# Patient Record
Sex: Female | Born: 1975 | Race: White | Hispanic: No | Marital: Married | Smoking: Never smoker
Health system: Southern US, Community
[De-identification: ages and names within clinical notes are randomized; demographics above are authoritative.]

## PROBLEM LIST (undated history)

## (undated) DIAGNOSIS — T4145XA Adverse effect of unspecified anesthetic, initial encounter: Secondary | ICD-10-CM

## (undated) DIAGNOSIS — T8859XA Other complications of anesthesia, initial encounter: Secondary | ICD-10-CM

## (undated) DIAGNOSIS — N979 Female infertility, unspecified: Secondary | ICD-10-CM

## (undated) DIAGNOSIS — IMO0002 Reserved for concepts with insufficient information to code with codable children: Secondary | ICD-10-CM

## (undated) HISTORY — PX: OTHER SURGICAL HISTORY: SHX169

## (undated) HISTORY — PX: LEEP: SHX91

---

## 2008-01-15 ENCOUNTER — Inpatient Hospital Stay (HOSPITAL_COMMUNITY): Admission: AD | Admit: 2008-01-15 | Discharge: 2008-01-15 | Payer: Self-pay | Admitting: Obstetrics and Gynecology

## 2009-08-06 ENCOUNTER — Inpatient Hospital Stay (HOSPITAL_COMMUNITY): Admission: AD | Admit: 2009-08-06 | Discharge: 2009-08-10 | Payer: Self-pay | Admitting: Obstetrics and Gynecology

## 2009-08-07 ENCOUNTER — Encounter (HOSPITAL_COMMUNITY): Payer: Self-pay | Admitting: Obstetrics and Gynecology

## 2010-09-22 LAB — CBC
MCV: 91.2 fL (ref 78.0–100.0)
Platelets: 221 10*3/uL (ref 150–400)
Platelets: 240 10*3/uL (ref 150–400)
WBC: 15.4 10*3/uL — ABNORMAL HIGH (ref 4.0–10.5)
WBC: 27.9 10*3/uL — ABNORMAL HIGH (ref 4.0–10.5)

## 2010-11-12 LAB — RPR: RPR: NONREACTIVE

## 2010-11-12 LAB — RUBELLA ANTIBODY, IGM: Rubella: IMMUNE

## 2010-11-12 LAB — ABO/RH: RH Type: POSITIVE

## 2010-11-12 LAB — CBC: Hemoglobin: 12.7 g/dL (ref 12.0–16.0)

## 2011-01-31 IMAGING — US US OB COMP +14 WK
1 series · 14 of 28 positions shown · non-contrast
Comparison: none

OBSTETRICAL ULTRASOUND:
 This ultrasound exam was performed in the [HOSPITAL] Ultrasound Department.  The OB US report was generated in the AS system, and faxed to the ordering physician.  This report is also available in [HOSPITAL]?s AccessANYware and in [REDACTED] PACS.

[Series 1: us ob comp +14 wk · 14 of 33 slices shown]
[im 2/33]
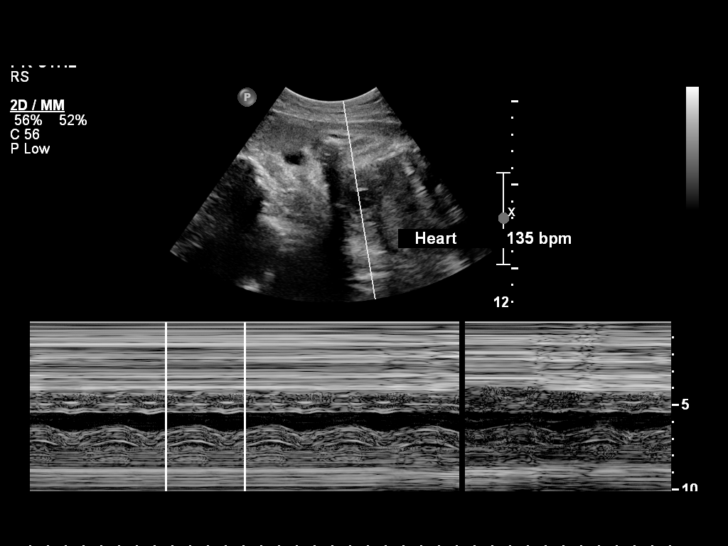
[im 4/33]
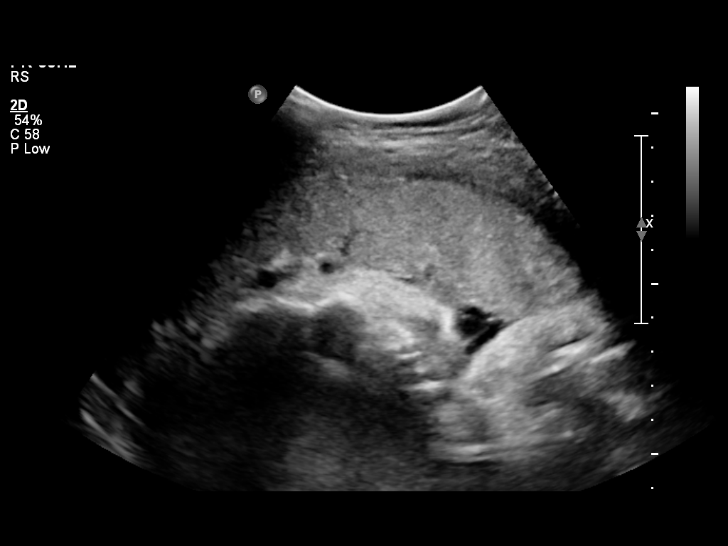
[im 6/33]
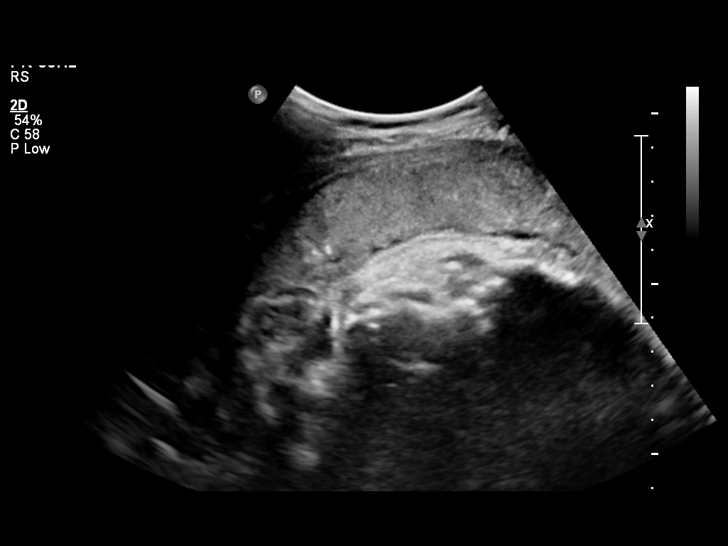
[im 9/33]
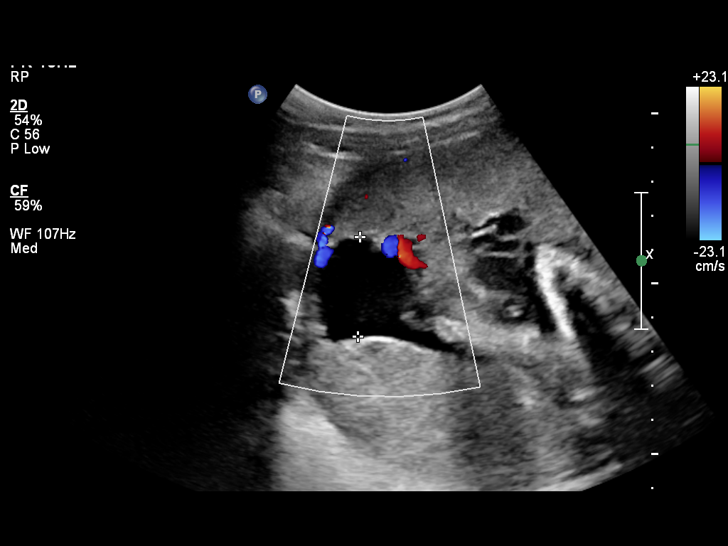
[im 11/33]
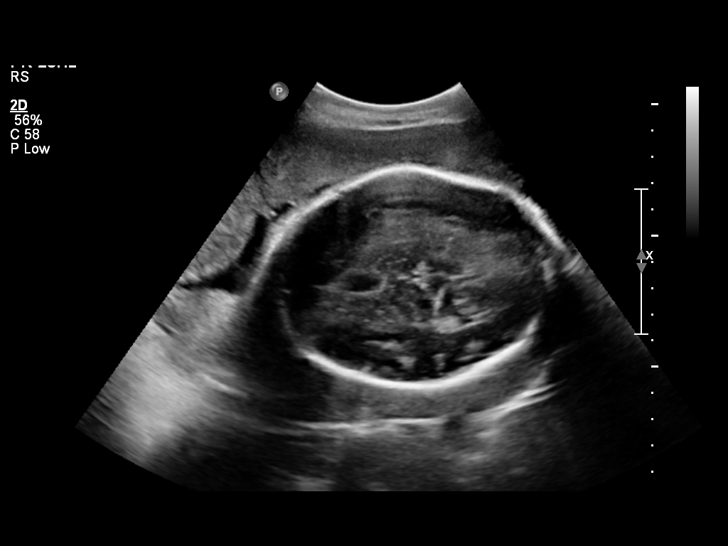
[im 14/33]
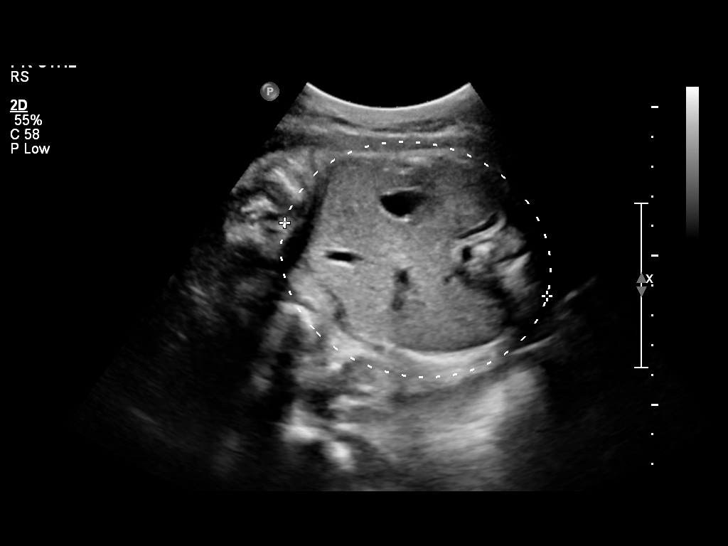
[im 16/33]
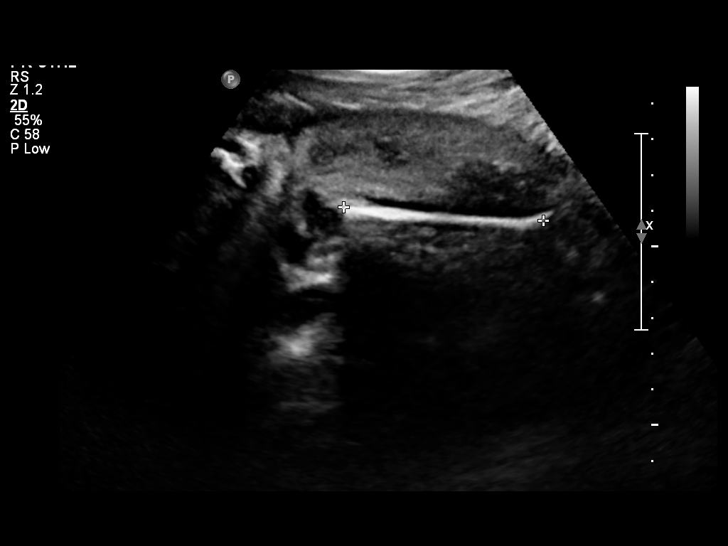
[im 18/33]
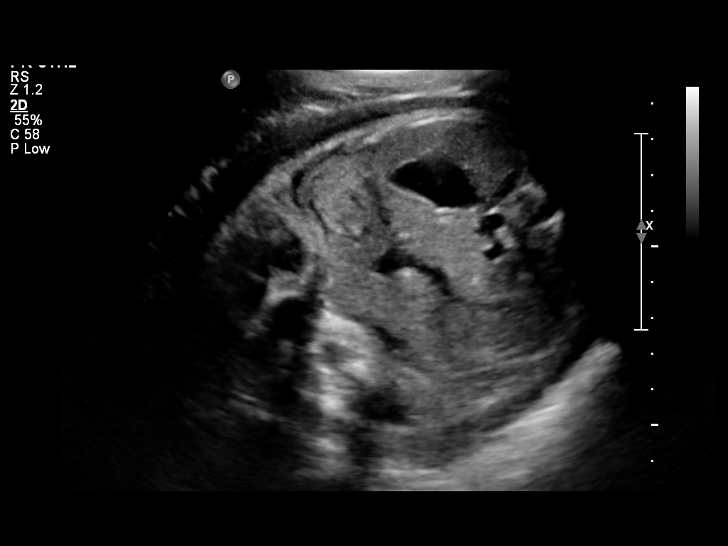
[im 21/33]
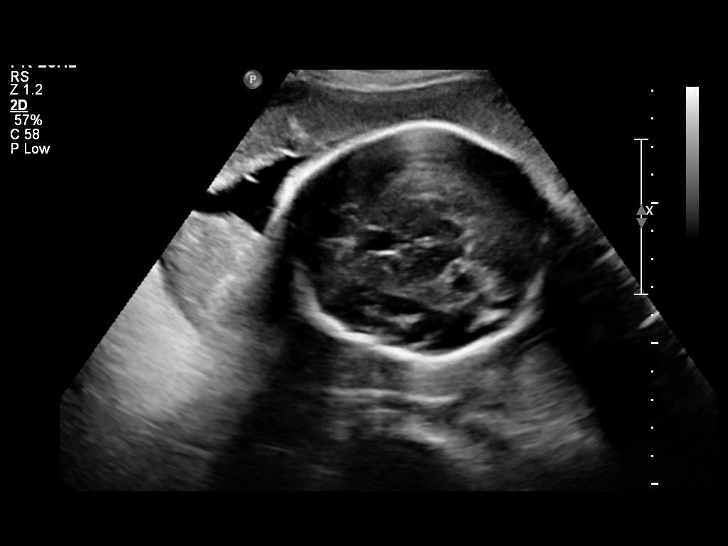
[im 23/33]
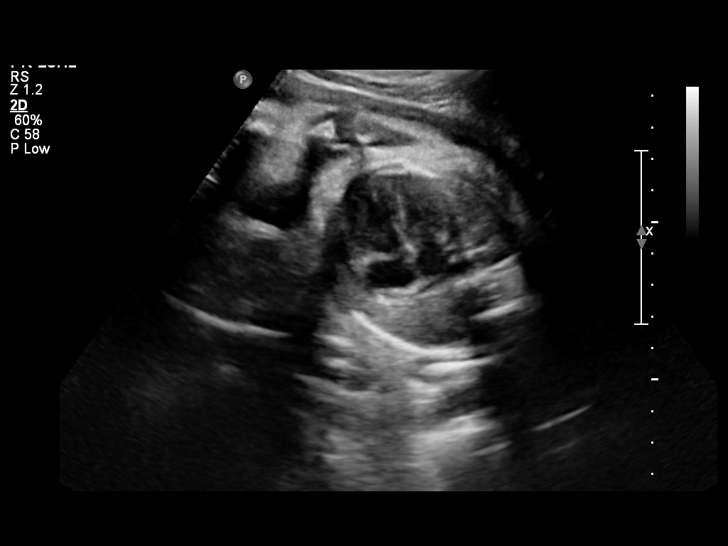
[im 25/33]
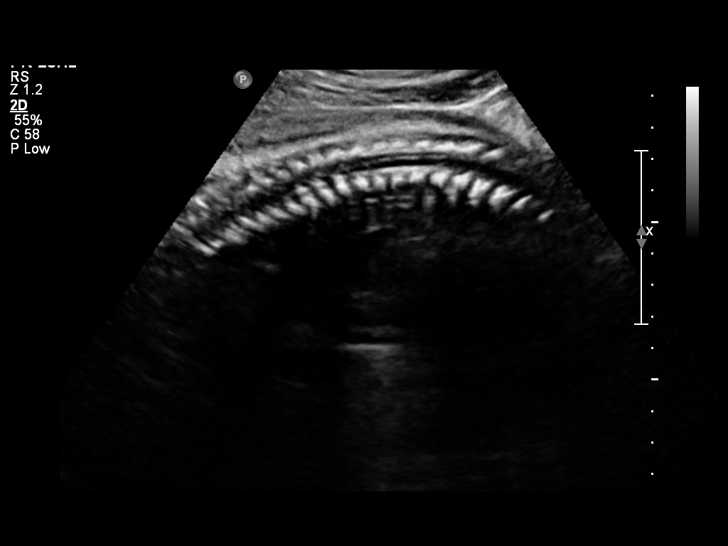
[im 28/33]
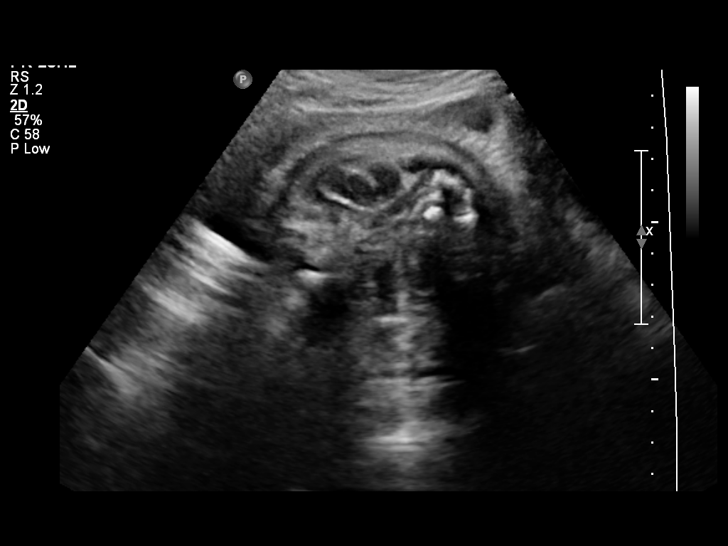
[im 30/33]
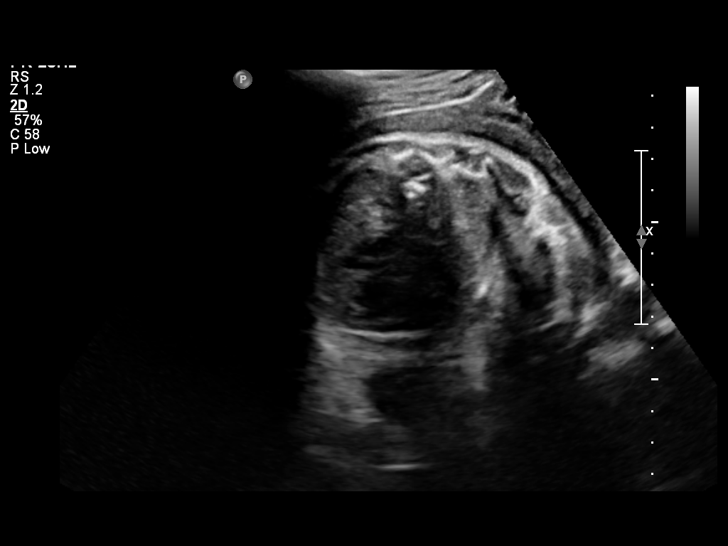
[im 33/33]
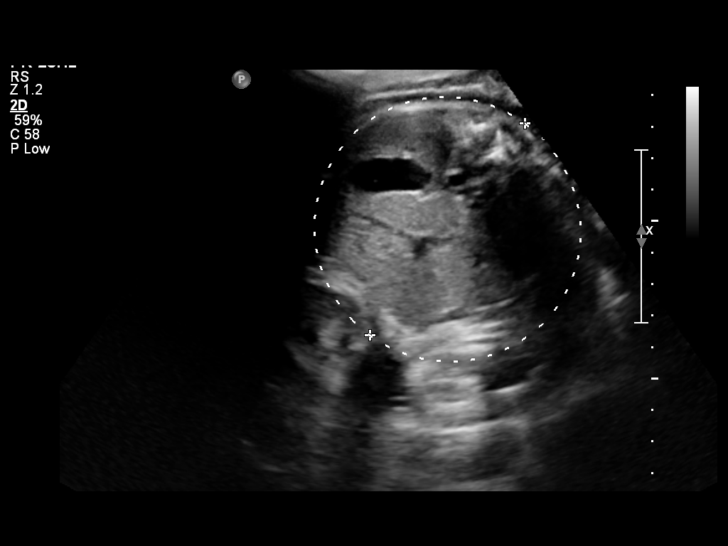

[14 of 28 positions shown; findings below may reference images not displayed]

IMPRESSION: See AS Obstetric US report.

## 2011-06-01 ENCOUNTER — Inpatient Hospital Stay (HOSPITAL_COMMUNITY): Payer: Managed Care, Other (non HMO) | Admitting: Anesthesiology

## 2011-06-01 ENCOUNTER — Encounter (HOSPITAL_COMMUNITY): Payer: Self-pay | Admitting: Anesthesiology

## 2011-06-01 ENCOUNTER — Encounter (HOSPITAL_COMMUNITY)
Admission: AD | Disposition: A | Discharge status: Home / Self Care | Payer: Self-pay | Source: Ambulatory Visit | Attending: Obstetrics and Gynecology

## 2011-06-01 ENCOUNTER — Encounter (HOSPITAL_COMMUNITY): Payer: Self-pay | Admitting: *Deleted

## 2011-06-01 ENCOUNTER — Encounter (HOSPITAL_COMMUNITY): Payer: Self-pay

## 2011-06-01 ENCOUNTER — Inpatient Hospital Stay (HOSPITAL_COMMUNITY)
Admission: AD | Admit: 2011-06-01 | Discharge: 2011-06-04 | DRG: 765 | Disposition: A | Discharge status: Home / Self Care | Payer: Managed Care, Other (non HMO) | Source: Ambulatory Visit | Attending: Obstetrics and Gynecology | Admitting: Obstetrics and Gynecology

## 2011-06-01 DIAGNOSIS — Z98891 History of uterine scar from previous surgery: Secondary | ICD-10-CM

## 2011-06-01 DIAGNOSIS — O321XX0 Maternal care for breech presentation, not applicable or unspecified: Principal | ICD-10-CM | POA: Diagnosis present

## 2011-06-01 DIAGNOSIS — O34219 Maternal care for unspecified type scar from previous cesarean delivery: Secondary | ICD-10-CM | POA: Diagnosis present

## 2011-06-01 DIAGNOSIS — O09529 Supervision of elderly multigravida, unspecified trimester: Secondary | ICD-10-CM | POA: Diagnosis present

## 2011-06-01 HISTORY — DX: Other complications of anesthesia, initial encounter: T88.59XA

## 2011-06-01 HISTORY — DX: Female infertility, unspecified: N97.9

## 2011-06-01 HISTORY — DX: Reserved for concepts with insufficient information to code with codable children: IMO0002

## 2011-06-01 HISTORY — DX: Adverse effect of unspecified anesthetic, initial encounter: T41.45XA

## 2011-06-01 LAB — CBC
Hemoglobin: 13.1 g/dL (ref 12.0–15.0)
WBC: 10 10*3/uL (ref 4.0–10.5)

## 2011-06-01 SURGERY — Surgical Case
Anesthesia: Regional | Site: Uterus | Wound class: Clean Contaminated

## 2011-06-01 MED ORDER — CEFAZOLIN SODIUM 1-5 GM-% IV SOLN
1.0000 g | INTRAVENOUS | Status: AC
  Administered 2011-06-01: 1 g via INTRAVENOUS
  Filled 2011-06-01: qty 50

## 2011-06-01 MED ORDER — NALOXONE HCL 0.4 MG/ML IJ SOLN: 0.4000 mg | INTRAMUSCULAR | Status: DC | PRN

## 2011-06-01 MED ORDER — ZOLPIDEM TARTRATE 5 MG PO TABS: 5.0000 mg | ORAL_TABLET | Freq: Every evening | ORAL | Status: DC | PRN

## 2011-06-01 MED ORDER — KETOROLAC TROMETHAMINE 30 MG/ML IJ SOLN
15.0000 mg | Freq: Once | INTRAMUSCULAR | Status: AC | PRN
  Administered 2011-06-01: 30 mg via INTRAVENOUS

## 2011-06-01 MED ORDER — WITCH HAZEL-GLYCERIN EX PADS: 1.0000 "application " | MEDICATED_PAD | CUTANEOUS | Status: DC | PRN

## 2011-06-01 MED ORDER — SODIUM CHLORIDE 0.9 % IV SOLN: 250.0000 mL | INTRAVENOUS | Status: DC | PRN

## 2011-06-01 MED ORDER — TETANUS-DIPHTH-ACELL PERTUSSIS 5-2.5-18.5 LF-MCG/0.5 IM SUSP: 0.5000 mL | Freq: Once | INTRAMUSCULAR | Status: DC

## 2011-06-01 MED ORDER — IBUPROFEN 800 MG PO TABS: 800.0000 mg | ORAL_TABLET | Freq: Three times a day (TID) | ORAL | Status: DC | PRN

## 2011-06-01 MED ORDER — SCOPOLAMINE 1 MG/3DAYS TD PT72
MEDICATED_PATCH | TRANSDERMAL | Status: AC
  Administered 2011-06-01: 1.5 mg via CUTANEOUS
  Filled 2011-06-01: qty 1

## 2011-06-01 MED ORDER — SODIUM CHLORIDE 0.9 % IJ SOLN: 3.0000 mL | Freq: Two times a day (BID) | INTRAMUSCULAR | Status: DC

## 2011-06-01 MED ORDER — KETOROLAC TROMETHAMINE 60 MG/2ML IM SOLN
60.0000 mg | Freq: Once | INTRAMUSCULAR | Status: AC | PRN
  Filled 2011-06-01: qty 2

## 2011-06-01 MED ORDER — PRENATAL PLUS 27-1 MG PO TABS
1.0000 | ORAL_TABLET | Freq: Every day | ORAL | Status: DC
  Administered 2011-06-02 – 2011-06-04 (×4): 1 via ORAL
  Filled 2011-06-01 (×3): qty 1

## 2011-06-01 MED ORDER — PROMETHAZINE HCL 25 MG/ML IJ SOLN: 6.2500 mg | INTRAMUSCULAR | Status: DC | PRN

## 2011-06-01 MED ORDER — DIPHENHYDRAMINE HCL 25 MG PO CAPS: 25.0000 mg | ORAL_CAPSULE | Freq: Four times a day (QID) | ORAL | Status: DC | PRN

## 2011-06-01 MED ORDER — MENTHOL 3 MG MT LOZG: 1.0000 | LOZENGE | OROMUCOSAL | Status: DC | PRN

## 2011-06-01 MED ORDER — SENNOSIDES-DOCUSATE SODIUM 8.6-50 MG PO TABS
2.0000 | ORAL_TABLET | Freq: Every day | ORAL | Status: DC
  Administered 2011-06-02 – 2011-06-03 (×2): 2 via ORAL

## 2011-06-01 MED ORDER — DIBUCAINE 1 % RE OINT: 1.0000 "application " | TOPICAL_OINTMENT | RECTAL | Status: DC | PRN

## 2011-06-01 MED ORDER — MEASLES, MUMPS & RUBELLA VAC ~~LOC~~ INJ
0.5000 mL | INJECTION | Freq: Once | SUBCUTANEOUS | Status: DC
  Filled 2011-06-01: qty 0.5

## 2011-06-01 MED ORDER — SODIUM CHLORIDE 0.9 % IJ SOLN: 3.0000 mL | INTRAMUSCULAR | Status: DC | PRN

## 2011-06-01 MED ORDER — FLEET ENEMA 7-19 GM/118ML RE ENEM: 1.0000 | ENEMA | RECTAL | Status: DC | PRN

## 2011-06-01 MED ORDER — LACTATED RINGERS IV SOLN
INTRAVENOUS | Status: DC
  Administered 2011-06-01: 20:00:00 via INTRAVENOUS

## 2011-06-01 MED ORDER — DIPHENHYDRAMINE HCL 50 MG/ML IJ SOLN: 25.0000 mg | INTRAMUSCULAR | Status: DC | PRN

## 2011-06-01 MED ORDER — SIMETHICONE 80 MG PO CHEW
80.0000 mg | CHEWABLE_TABLET | Freq: Three times a day (TID) | ORAL | Status: DC
  Administered 2011-06-02 – 2011-06-04 (×7): 80 mg via ORAL

## 2011-06-01 MED ORDER — CITRIC ACID-SODIUM CITRATE 334-500 MG/5ML PO SOLN
30.0000 mL | Freq: Once | ORAL | Status: AC
  Administered 2011-06-01: 30 mL via ORAL
  Filled 2011-06-01: qty 15

## 2011-06-01 MED ORDER — OXYTOCIN 20 UNITS IN LACTATED RINGERS INFUSION - SIMPLE
INTRAVENOUS | Status: DC | PRN
  Administered 2011-06-01: 20 [IU] via INTRAVENOUS

## 2011-06-01 MED ORDER — KETOROLAC TROMETHAMINE 30 MG/ML IJ SOLN: 30.0000 mg | Freq: Four times a day (QID) | INTRAMUSCULAR | Status: AC | PRN

## 2011-06-01 MED ORDER — MORPHINE SULFATE (PF) 0.5 MG/ML IJ SOLN
INTRAMUSCULAR | Status: DC | PRN
  Administered 2011-06-01: .1 mg via INTRATHECAL

## 2011-06-01 MED ORDER — LACTATED RINGERS IV SOLN
INTRAVENOUS | Status: DC | PRN
  Administered 2011-06-01 (×3): via INTRAVENOUS

## 2011-06-01 MED ORDER — SIMETHICONE 80 MG PO CHEW: 80.0000 mg | CHEWABLE_TABLET | ORAL | Status: DC | PRN

## 2011-06-01 MED ORDER — FENTANYL CITRATE 0.05 MG/ML IJ SOLN
INTRAMUSCULAR | Status: DC | PRN
  Administered 2011-06-01: 12.5 ug via INTRATHECAL

## 2011-06-01 MED ORDER — OXYTOCIN 20 UNITS IN LACTATED RINGERS INFUSION - SIMPLE
INTRAVENOUS | Status: AC
  Filled 2011-06-01: qty 1000

## 2011-06-01 MED ORDER — SCOPOLAMINE 1 MG/3DAYS TD PT72
1.0000 | MEDICATED_PATCH | Freq: Once | TRANSDERMAL | Status: AC
  Administered 2011-06-01: 1.5 mg via CUTANEOUS

## 2011-06-01 MED ORDER — KETOROLAC TROMETHAMINE 30 MG/ML IJ SOLN
INTRAMUSCULAR | Status: AC
  Filled 2011-06-01: qty 1

## 2011-06-01 MED ORDER — ONDANSETRON HCL 4 MG/2ML IJ SOLN
INTRAMUSCULAR | Status: DC | PRN
  Administered 2011-06-01: 4 mg via INTRAVENOUS

## 2011-06-01 MED ORDER — OXYTOCIN 20 UNITS IN LACTATED RINGERS INFUSION - SIMPLE
125.0000 mL/h | INTRAVENOUS | Status: AC
  Administered 2011-06-01: 12:00:00 via INTRAVENOUS

## 2011-06-01 MED ORDER — BISACODYL 10 MG RE SUPP: 10.0000 mg | Freq: Every day | RECTAL | Status: DC | PRN

## 2011-06-01 MED ORDER — BUPIVACAINE IN DEXTROSE 0.75-8.25 % IT SOLN
INTRATHECAL | Status: DC | PRN
  Administered 2011-06-01: 10.5 mg via INTRATHECAL

## 2011-06-01 MED ORDER — NALBUPHINE HCL 10 MG/ML IJ SOLN
5.0000 mg | INTRAMUSCULAR | Status: DC | PRN
  Filled 2011-06-01: qty 1

## 2011-06-01 MED ORDER — IBUPROFEN 600 MG PO TABS
600.0000 mg | ORAL_TABLET | Freq: Four times a day (QID) | ORAL | Status: DC | PRN
  Administered 2011-06-01 – 2011-06-04 (×9): 600 mg via ORAL
  Filled 2011-06-01 (×9): qty 1

## 2011-06-01 MED ORDER — FAMOTIDINE IN NACL 20-0.9 MG/50ML-% IV SOLN
20.0000 mg | Freq: Once | INTRAVENOUS | Status: AC
  Administered 2011-06-01: 20 mg via INTRAVENOUS
  Filled 2011-06-01: qty 50

## 2011-06-01 MED ORDER — LANOLIN HYDROUS EX OINT: 1.0000 "application " | TOPICAL_OINTMENT | CUTANEOUS | Status: DC | PRN

## 2011-06-01 MED ORDER — MEPERIDINE HCL 25 MG/ML IJ SOLN: 6.2500 mg | INTRAMUSCULAR | Status: DC | PRN

## 2011-06-01 MED ORDER — SODIUM CHLORIDE 0.9 % IV SOLN
1.0000 ug/kg/h | INTRAVENOUS | Status: DC | PRN
  Filled 2011-06-01: qty 2.5

## 2011-06-01 MED ORDER — DIPHENHYDRAMINE HCL 25 MG PO CAPS: 25.0000 mg | ORAL_CAPSULE | ORAL | Status: DC | PRN

## 2011-06-01 MED ORDER — HYDROMORPHONE HCL PF 1 MG/ML IJ SOLN: 0.2500 mg | INTRAMUSCULAR | Status: DC | PRN

## 2011-06-01 MED ORDER — NALBUPHINE HCL 10 MG/ML IJ SOLN
5.0000 mg | INTRAMUSCULAR | Status: DC | PRN
  Administered 2011-06-01 (×3): 5 mg via INTRAVENOUS
  Filled 2011-06-01 (×2): qty 1

## 2011-06-01 MED ORDER — ONDANSETRON HCL 4 MG/2ML IJ SOLN: 4.0000 mg | Freq: Three times a day (TID) | INTRAMUSCULAR | Status: DC | PRN

## 2011-06-01 MED ORDER — DIPHENHYDRAMINE HCL 50 MG/ML IJ SOLN
12.5000 mg | INTRAMUSCULAR | Status: DC | PRN
  Administered 2011-06-01: 17:00:00 via INTRAVENOUS
  Filled 2011-06-01: qty 1

## 2011-06-01 SURGICAL SUPPLY — 24 items
CLOTH BEACON ORANGE TIMEOUT ST (SAFETY) ×2 IMPLANT
DRESSING TELFA 8X3 (GAUZE/BANDAGES/DRESSINGS) IMPLANT
DRSG COVADERM 4X10 (GAUZE/BANDAGES/DRESSINGS) ×2 IMPLANT
ELECT REM PT RETURN 9FT ADLT (ELECTROSURGICAL) ×2
ELECTRODE REM PT RTRN 9FT ADLT (ELECTROSURGICAL) ×1 IMPLANT
EXTRACTOR VACUUM M CUP 4 TUBE (SUCTIONS) IMPLANT
GAUZE SPONGE 4X4 12PLY STRL LF (GAUZE/BANDAGES/DRESSINGS) ×4 IMPLANT
GLOVE BIO SURGEON STRL SZ7 (GLOVE) ×4 IMPLANT
GOWN PREVENTION PLUS LG XLONG (DISPOSABLE) ×6 IMPLANT
KIT ABG SYR 3ML LUER SLIP (SYRINGE) IMPLANT
NEEDLE HYPO 25X5/8 SAFETYGLIDE (NEEDLE) ×2 IMPLANT
NS IRRIG 1000ML POUR BTL (IV SOLUTION) ×2 IMPLANT
PACK C SECTION WH (CUSTOM PROCEDURE TRAY) ×2 IMPLANT
PAD ABD 7.5X8 STRL (GAUZE/BANDAGES/DRESSINGS) IMPLANT
PENCIL BUTTON HOLSTER BLD 10FT (ELECTRODE) ×2 IMPLANT
SLEEVE SCD COMPRESS KNEE MED (MISCELLANEOUS) ×2 IMPLANT
SUT CHROMIC 0 CTX 36 (SUTURE) ×6 IMPLANT
SUT MON AB 4-0 PS1 27 (SUTURE) ×2 IMPLANT
SUT PDS AB 0 CT1 27 (SUTURE) ×4 IMPLANT
SUT VIC AB 3-0 CT1 27 (SUTURE) ×2
SUT VIC AB 3-0 CT1 TAPERPNT 27 (SUTURE) ×2 IMPLANT
TOWEL OR 17X24 6PK STRL BLUE (TOWEL DISPOSABLE) ×4 IMPLANT
TRAY FOLEY CATH 14FR (SET/KITS/TRAYS/PACK) ×2 IMPLANT
WATER STERILE IRR 1000ML POUR (IV SOLUTION) ×2 IMPLANT

## 2011-06-01 NOTE — H&P (Signed)
Taylor Huerta is a 35 y.o. female presenting for SROM + labor. Hx prior CS  For breech and hx PTD, has been on weekly progesterone IM, presents with SOM + labor, declines VBAC even if vtx.. Maternal Medical History:  Reason for admission: Reason for admission: rupture of membranes and contractions.  Contractions: Onset was 1-2 hours ago.   Frequency: regular.   Perceived severity is moderate.    Fetal activity: Perceived fetal activity is normal.   Last perceived fetal movement was within the past hour.      OB History    Grav Para Term Preterm Abortions TAB SAB Ect Mult Living   2 1 0 1 0 0 0 0 0 1      Past Medical History  Diagnosis Date  . Complication of anesthesia     severe itching  . Abnormal Pap smear   . 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 Cesarean section   . Compartment release l leg   . Leep    Family History: family history is negative for Anesthesia problems. Social History:  reports that she has never smoked. She has never used smokeless tobacco. She reports that she drinks alcohol. She reports that she does not use illicit drugs.  ROS    Blood pressure 121/88, pulse 72, temperature 97.2 F (36.2 C), temperature source Oral, resp. rate 20, height 5\' 2"  (1.575 m), weight 62.596 kg (138 lb), SpO2 98.00%. Maternal Exam:  Uterine Assessment: Contraction strength is moderate.  Contraction frequency is regular.   Abdomen: Patient reports no abdominal tenderness. Surgical scars: low transverse.   Fundal height is 34.   Estimated fetal weight is AGA.   Fetal presentation: breech     Physical Exam  HENT:  Head: Normocephalic.  Neck: Normal range of motion. Neck supple.  Cardiovascular: Normal rate.   Respiratory: Effort normal and breath sounds normal.  GI:       34 cm FH,FHR 142  Genitourinary:       Clear AF    Prenatal labs: ABO, Rh: O/Positive/-- (05/11 0000) Antibody: Negative (05/11  0000) Rubella: Immune (05/11 0000) RPR: Nonreactive (05/11 0000)  HBsAg: Negative (05/11 0000)  HIV: Non-reactive (05/11 0000)  GBS:     Assessment/Plan: 36 + week IUP, SROM + labor, prior CS, declines VBAC  Discussed R CS, risks secondary to infxn,bleeding, transf,adjacent organ injury reviewed.   Taylor Huerta 06/01/2011, 8:47 AM

## 2011-06-01 NOTE — Anesthesia Preprocedure Evaluation (Signed)
Anesthesia Evaluation  Patient identified by MRN, date of birth, ID band Patient awake    Reviewed: Allergy & Precautions, H&P , NPO status , Patient's Chart, lab work & pertinent test results  Airway Mallampati: I TM Distance: >3 FB Neck ROM: full    Dental No notable dental hx.    Pulmonary neg pulmonary ROS,  clear to auscultation  Pulmonary exam normal       Cardiovascular neg cardio ROS     Neuro/Psych Negative Neurological ROS  Negative Psych ROS   GI/Hepatic negative GI ROS, Neg liver ROS,   Endo/Other  Negative Endocrine ROS  Renal/GU negative Renal ROS  Genitourinary negative   Musculoskeletal negative musculoskeletal ROS (+)   Abdominal Normal abdominal exam  (+)   Peds negative pediatric ROS (+)  Hematology negative hematology ROS (+)   Anesthesia Other Findings   Reproductive/Obstetrics (+) Pregnancy                           Anesthesia Physical Anesthesia Plan  ASA: II and Emergent  Anesthesia Plan: Spinal   Post-op Pain Management:    Induction:   Airway Management Planned:   Additional Equipment:   Intra-op Plan:   Post-operative Plan:   Informed Consent: I have reviewed the patients History and Physical, chart, labs and discussed the procedure including the risks, benefits and alternatives for the proposed anesthesia with the patient or authorized representative who has indicated his/her understanding and acceptance.     Plan Discussed with: CRNA  Anesthesia Plan Comments:         Anesthesia Quick Evaluation

## 2011-06-01 NOTE — Anesthesia Postprocedure Evaluation (Signed)
Anesthesia Post Note  Patient: Taylor Huerta  Procedure(s) Performed:  CESAREAN SECTION  Anesthesia type: Spinal  Patient location: PACU  Post pain: Pain level controlled  Post assessment: Post-op Vital signs reviewed  Last Vitals:  Filed Vitals:   06/01/11 1115  BP: 106/63  Pulse: 60  Temp:   Resp:     Post vital signs: Reviewed  Level of consciousness: awake  Complications: No apparent anesthesia complications

## 2011-06-01 NOTE — Brief Op Note (Signed)
06/01/2011  10:45 AM  PATIENT:  Taylor Huerta  35 y.o. female  PRE-OPERATIVE DIAGNOSIS:  Membranes Ruptured  POST-OPERATIVE DIAGNOSIS:  Membranes Ruptured  PROCEDURE:  Procedure(s): CESAREAN SECTION  SURGEON:  Surgeon(s): Shlome Baldree [employer]  PHYSICIAN ASSISTANT:   ASSISTANTS: none   ANESTHESIA:   spinal  EBL:  Total I/O In: 1600 [I.V.:1600] Out: 700 [Urine:300; Blood:400]  BLOOD ADMINISTERED:none  DRAINS: Urinary Catheter (Foley)   LOCAL MEDICATIONS USED:  NONE  SPECIMEN:  No Specimen  DISPOSITION OF SPECIMEN:  N/A  COUNTS:  YES  TOURNIQUET:  * No tourniquets in log *  DICTATION: .Dragon Dictation  PLAN OF CARE: Admit to inpatient   PATIENT DISPOSITION:  PACU - hemodynamically stable.   Delay start of Pharmacological VTE agent (>24hrs) due to surgical blood loss or risk of bleeding:  {YES/NO/NOT APPLICABLE:20182  Preoperative diagnosis: 36+ week IUP, SROM was labor, breech presentation, previous cesarean section, declines VBAC  Postoperative diagnosis: Same  Procedure: Repeat low transverse cesarean section  Surgeon: Marcelle Overlie  Anesthesia: Spinal  EBL: 700 cc  Specimens: Placenta to labor and delivery  Procedure and findings:  The patient was taken the operating room after an adequate level of spinal anesthetic was obtained with the patient in left tilt position the abdomen prepped and draped in usual manner for cesarean section, Foley catheter positioned draining clear urine. Transverse incision made excising the old scar this is carried down to the fascia which was incised and extended transversely. Rectus muscle divided in the midline peritoneum entered superiorly without incident and extended vertically. The vesicouterine serosa was incised and the bladder was bluntly and sharply dissected below, bladder blade repositioned that point. Transverse incision made lower segment extended with blunt dissection clear fluid noted the patient was then delivered  of a female in frank breech presentation, the infant was suctioned cord clamped and passed the pediatric team for further care. Placenta was then delivered spontaneously intact, uterus exteriorized cavity wiped clean with a laparotomy pack closure obtained the first layer of 0 chromic in a locked fashion followed by an imbricating layer of 0 chromic this is hemostatic the bladder flap area was intact and hemostatic bilateral tubes and ovaries were normal prior to closure sponge denies precast reported as correct x2. Then closed with a running 2-0 Vicryl suture. 2-0 Vicryl running suture used to close the rectus muscles in the midline 0 PDS was then used from laterally to midline on either side a closed fashion subcutaneous tissue is tan and hemostatic 4-0 Monocryl subcuticular suture with a pressure dressing applied she did receive Ancef 1 g IV preop and Pitocin IV after the cord was clamped clear urine noted in the case. Mother and baby doing well at that point.  Taylor Huerta.D.

## 2011-06-01 NOTE — Anesthesia Procedure Notes (Signed)
Spinal  Patient location during procedure: OR Start time: 06/01/2011 10:02 AM End time: 06/01/2011 10:05 AM Staffing Anesthesiologist: Sandrea Hughs Performed by: anesthesiologist  Preanesthetic Checklist Completed: patient identified, site marked, surgical consent, pre-op evaluation, timeout performed, IV checked, risks and benefits discussed and monitors and equipment checked Spinal Block Patient position: sitting Prep: DuraPrep Patient monitoring: heart rate, cardiac monitor, continuous pulse ox and blood pressure Approach: midline Location: L3-4 Injection technique: single-shot Needle Needle type: Sprotte  Needle gauge: 24 G Needle length: 9 cm Needle insertion depth: 4 cm Assessment Sensory level: T4

## 2011-06-01 NOTE — Transfer of Care (Signed)
Immediate Anesthesia Transfer of Care Note  Patient: Taylor Huerta  Procedure(s) Performed:  CESAREAN SECTION  Patient Location: PACU  Anesthesia Type: Spinal  Level of Consciousness: awake, alert  and oriented  Airway & Oxygen Therapy: Patient Spontanous Breathing  Post-op Assessment: Report given to PACU RN and Post -op Vital signs reviewed and stable  Post vital signs: Reviewed and stable  Complications: No apparent anesthesia complications

## 2011-06-01 NOTE — Progress Notes (Signed)
Patient states she had leaking of clear fluid that started at 0530, occasional mild contractions. Patient is scheduled for a repeat cesarean section on 12-14.

## 2011-06-02 ENCOUNTER — Encounter (HOSPITAL_COMMUNITY): Payer: Self-pay | Admitting: Obstetrics and Gynecology

## 2011-06-02 LAB — CBC
HCT: 31.8 % — ABNORMAL LOW (ref 36.0–46.0)
MCV: 85.9 fL (ref 78.0–100.0)
RDW: 12.8 % (ref 11.5–15.5)
WBC: 10.6 10*3/uL — ABNORMAL HIGH (ref 4.0–10.5)

## 2011-06-02 MED ORDER — OXYCODONE-ACETAMINOPHEN 5-325 MG PO TABS
2.0000 | ORAL_TABLET | Freq: Four times a day (QID) | ORAL | Status: DC | PRN
  Administered 2011-06-02 (×2): 1 via ORAL
  Administered 2011-06-02: 2 via ORAL
  Administered 2011-06-03: 1 via ORAL
  Administered 2011-06-03: 2 via ORAL
  Administered 2011-06-03: 1 via ORAL
  Administered 2011-06-03 – 2011-06-04 (×2): 2 via ORAL
  Filled 2011-06-02: qty 2
  Filled 2011-06-02 (×2): qty 1
  Filled 2011-06-02: qty 2
  Filled 2011-06-02 (×2): qty 1
  Filled 2011-06-02 (×2): qty 2
  Filled 2011-06-02: qty 1

## 2011-06-02 NOTE — Anesthesia Postprocedure Evaluation (Signed)
  Anesthesia Post-op Note  Patient: Taylor Huerta  Procedure(s) Performed:  CESAREAN SECTION  Patient Location: Mother/Baby  Anesthesia Type: Spinal  Level of Consciousness: alert  and oriented  Airway and Oxygen Therapy: Patient Spontanous Breathing  Post-op Pain: mild  Post-op Assessment: Patient's Cardiovascular Status Stable and Respiratory Function Stable  Post-op Vital Signs: stable  Complications: No apparent anesthesia complications

## 2011-06-02 NOTE — Progress Notes (Signed)
Subjective: Postpartum Day 1: Cesarean Delivery Patient reports tolerating PO, + flatus and no problems voiding.    Objective: Vital signs in last 24 hours: Temp:  [97 F (36.1 C)-98.6 F (37 C)] 98.3 F (36.8 C) (11/29 0827) Pulse Rate:  [57-67] 66  (11/29 0827) Resp:  [12-22] 20  (11/29 0827) BP: (101-126)/(60-84) 108/64 mmHg (11/29 0827) SpO2:  [95 %-100 %] 98 % (11/29 0827)  Physical Exam:  General: alert and cooperative Lochia: appropriate Uterine Fundus: firm abd dressing CDI DVT Evaluation: No evidence of DVT seen on physical exam.   Basename 06/02/11 0455 06/01/11 0855  HGB 11.2* 13.1  HCT 31.8* 36.7    Assessment/Plan: Status post Cesarean section. Doing well postoperatively.  Continue current care.  Hanny Elsberry G 06/02/2011, 8:29 AM

## 2011-06-02 NOTE — Addendum Note (Signed)
Addendum  created 06/02/11 1003 by Cephus Shelling   Modules edited:Notes Section

## 2011-06-03 NOTE — Progress Notes (Signed)
Subjective: Postpartum Day 2: Cesarean Delivery Patient reports tolerating PO, + flatus and no problems voiding.    Objective: Vital signs in last 24 hours: Temp:  [97.5 F (36.4 C)-98.2 F (36.8 C)] 97.8 F (36.6 C) (11/30 0639) Pulse Rate:  [58-68] 58  (11/30 0639) Resp:  [20] 20  (11/30 0639) BP: (104-110)/(65-70) 104/70 mmHg (11/30 0639) SpO2:  [99 %] 99 % (11/29 2145)  Physical Exam:  General: alert and cooperative Lochia: appropriate Uterine Fundus: firm abd dressing CDI DVT Evaluation: No evidence of DVT seen on physical exam.   Basename 06/02/11 0455 06/01/11 0855  HGB 11.2* 13.1  HCT 31.8* 36.7    Assessment/Plan: Status post Cesarean section. Doing well postoperatively.  Continue current care.  Sudiksha Victor G 06/03/2011, 8:52 AM

## 2011-06-04 MED ORDER — OXYCODONE-ACETAMINOPHEN 5-325 MG PO TABS: 2.0000 | ORAL_TABLET | Freq: Four times a day (QID) | ORAL | Status: AC | PRN

## 2011-06-04 MED ORDER — IBUPROFEN 800 MG PO TABS: 800.0000 mg | ORAL_TABLET | Freq: Three times a day (TID) | ORAL | Status: AC | PRN

## 2011-06-04 NOTE — Discharge Summary (Signed)
Obstetric Discharge Summary Reason for Admission: rupture of membranes Prenatal Procedures: none Intrapartum Procedures: cesarean: low cervical, transverse Postpartum Procedures: none Complications-Operative and Postpartum: none Hemoglobin  Date Value Range Status  06/02/2011 11.2* 12.0-15.0 (g/dL) Final     HCT  Date Value Range Status  06/02/2011 31.8* 36.0-46.0 (%) Final    Discharge Diagnoses: Term Pregnancy-delivered  Discharge Information: Date: 06/04/2011 Activity: pelvic rest Diet: routine Medications: Ibuprofen and Percocet Condition: improved Instructions: refer to practice specific booklet Discharge to: home   Newborn Data: Live born female  Birth Weight: 4 lb 15.4 oz (2250 g) APGAR: 9, 9  Home with mother.  Cherylee Rawlinson L 06/04/2011, 9:40 AM

## 2011-06-04 NOTE — Progress Notes (Signed)
Subjective: Postpartum Day 3: Cesarean Delivery Patient reports incisional pain, tolerating PO, + flatus and no problems voiding.    Objective: Vital signs in last 24 hours: Temp:  [97.6 F (36.4 C)-97.9 F (36.6 C)] 97.6 F (36.4 C) (12/01 0615) Pulse Rate:  [65-68] 68  (12/01 0615) Resp:  [18] 18  (12/01 0615) BP: (110-121)/(73-80) 115/77 mmHg (12/01 0615)  Physical Exam:  General: alert Lochia: appropriate Uterine Fundus: firm Incision: healing well, no significant drainage, no dehiscence, no significant erythema DVT Evaluation: No evidence of DVT seen on physical exam.   Basename 06/02/11 0455  HGB 11.2*  HCT 31.8*    Assessment/Plan: Status post Cesarean section. Doing well postoperatively.  Discharge home with standard precautions and return to clinic in 1 week.  Miro Balderson L 06/04/2011, 9:39 AM

## 2011-06-14 ENCOUNTER — Other Ambulatory Visit (HOSPITAL_COMMUNITY): Payer: Managed Care, Other (non HMO)

## 2011-06-17 ENCOUNTER — Encounter (HOSPITAL_COMMUNITY): Admission: RE | Payer: Self-pay | Source: Ambulatory Visit

## 2011-06-17 ENCOUNTER — Inpatient Hospital Stay (HOSPITAL_COMMUNITY)
Admission: RE | Admit: 2011-06-17 | Payer: Managed Care, Other (non HMO) | Source: Ambulatory Visit | Admitting: Obstetrics and Gynecology

## 2011-06-17 SURGERY — Surgical Case
Anesthesia: Regional

## 2011-09-08 ENCOUNTER — Ambulatory Visit (HOSPITAL_COMMUNITY)
Admission: RE | Admit: 2011-09-08 | Discharge: 2011-09-08 | Disposition: A | Discharge status: Home / Self Care | Payer: Managed Care, Other (non HMO) | Source: Ambulatory Visit | Attending: Obstetrics and Gynecology | Admitting: Obstetrics and Gynecology

## 2011-09-08 NOTE — Progress Notes (Signed)
Infant Lactation Consultation Outpatient Visit Note  Patient Name: Taylor Huerta  Infant - Taylor Huerta Date of Birth: August 10, 1975             DOB - [birth year] Birth Weight:  4 lbs. 15 oz.  Today's weight - 9 lbs. 4 oz. Gestational Age at Delivery: 76 weeks   Breastfeeding History Mom states she had been breastfeeding fine for almost 3 months, when [name] became sick with ear infection and congestion.  This caused her difficulty in breastfeeding, so Mom went to bottle (dr. Theora Gianotti) and pumping. She was surprised that she was only able to get 4 oz per pumping.  She gets just enough for [name], pumping 6 times a day.  Supplementing / Method: Pumping:  Type of Pump: Medela PIS   Frequency:  6 times a day   Volume:  4 oz. Per pumping  Comments:              [name] is using the pump she used to pump full time with her first child for a year.  Checked the pump pressures and it didn't reach 200 mm hg on high.  So she purchased a Freestyle Medela DEBP.  Consultation Evaluation: Assisted [name] with latching [name] in cross cradle hold, baby fussed a little, but was able to latch and feed for about 15 mins.  She transferred 30 ml.  When switching to 2nd breast, baby became very fussy, and Mom gave her a bottle of EBM.  Mom is VERY overwhelmed and tired.  She doesn't know how long she will be able to continue with the pumping as she has a 36 year old also.  Lot of listening and support given to [name].    It was decided she purchase a new pump (Freestyle), increase skin to skin time, use the breast as a dessert after bottles, try "power pumping" to simulate a baby's growth spurt, and information on [place] given.  Follow-Up  To call prn    [name] 09/08/2011, 11:47 AM

## 2012-03-19 ENCOUNTER — Ambulatory Visit: Payer: Managed Care, Other (non HMO) | Admitting: Family Medicine

## 2012-04-13 ENCOUNTER — Ambulatory Visit (INDEPENDENT_AMBULATORY_CARE_PROVIDER_SITE_OTHER): Payer: Managed Care, Other (non HMO) | Admitting: Family Medicine

## 2012-04-13 ENCOUNTER — Encounter: Payer: Self-pay | Admitting: Family Medicine

## 2012-04-13 VITALS — BP 92/72 | HR 72 | Temp 97.7°F | Resp 12 | Ht 62.0 in | Wt 118.0 lb

## 2012-04-13 DIAGNOSIS — Z7689 Persons encountering health services in other specified circumstances: Secondary | ICD-10-CM

## 2012-04-13 DIAGNOSIS — Z7189 Other specified counseling: Secondary | ICD-10-CM

## 2012-04-13 DIAGNOSIS — Z23 Encounter for immunization: Secondary | ICD-10-CM

## 2012-04-13 NOTE — Patient Instructions (Addendum)
Consider complete physical at some point later this year. 

## 2012-04-13 NOTE — Progress Notes (Signed)
  Subjective:    Patient ID: Taylor Huerta, female    DOB: 1976-06-01, 36 y.o.   MRN: [id number]  HPI  Seen to establish care. Patient sees gynecologist regularly. She's had C-sections back in February 2011 in November 2012. She has 2 healthy young girls. She has no chronic medical problems. Takes no medications. No reported drug allergies.  Family history significant for father with Parkinson's disease. Grandparent with type 2 diabetes.  Patient is a homemaker. Nonsmoker. Occasional alcohol use.  Review of Systems  Constitutional: Negative for chills, activity change, appetite change and unexpected weight change.  Respiratory: Negative for shortness of breath.   Cardiovascular: Negative for chest pain.  Gastrointestinal: Negative for abdominal pain.  Neurological: Negative for headaches.  Hematological: Negative for adenopathy.       Objective:   Physical Exam  Constitutional: She is oriented to person, place, and time. She appears well-developed and well-nourished.  Neck: Neck supple. No thyromegaly present.  Cardiovascular: Normal rate and regular rhythm.   Pulmonary/Chest: Effort normal and breath sounds normal. No respiratory distress. She has no wheezes. She has no rales.  Neurological: She is alert and oriented to person, place, and time.          Assessment & Plan:  Patient seen to establish care. Generally very healthy. We have encouraged her to consider complete physical. Influenza vaccine given. Her tetanus is up-to-date.

## 2012-07-09 ENCOUNTER — Telehealth: Payer: Self-pay | Admitting: Family Medicine

## 2012-07-09 NOTE — Telephone Encounter (Signed)
Home care given. Encounter closed.

## 2012-07-09 NOTE — Telephone Encounter (Signed)
Patient Information:  Caller Name: Dorathy  Phone: [phone]  Patient: Taylor Huerta, Schoenfelder  Gender: Female  DOB: [birth year]  Age: 37 Years  PCP: Evelena Peat [facility])  Pregnant: No  Office Follow Up:  Does the office need to follow up with this patient?: No  Instructions For The Office: N/A  RN Note:  No contraception. LMP 06/17/12. Polytrim eye drops; sig: 2 drops both eyes QID X 5 days, no refills called to Pharmacist answering machine at [facility] per MD protocol.  Symptoms  Reason For Call & Symptoms: Requests antibiotic for "pink eyes" with "goopy" eye discharge and pink sclera" in both eyes, worse in right eye.  Reviewed Health History In EMR: Yes  Reviewed Medications In EMR: Yes  Reviewed Allergies In EMR: Yes  Reviewed Surgeries / Procedures: Yes  Date of Onset of Symptoms: 07/08/2012 OB / GYN:  LMP: 06/17/2012  Guideline(s) Used:  Eye - Pus or Discharge  Disposition Per Guideline:   Home Care  Reason For Disposition Reached:   Eye with yellow/green discharge or eyelashes stick together, and PCP standing order to call in antibiotic eye drops  Advice Given:  Reassurance:  Pink eye will not harm your eyesight.  Here is some care advice that should help.  Eyelid Cleansing:   Gently wash eyelids and lashes with warm water and wet cotton balls (or cotton gauze). Remove all the dried and liquid pus.  Do this as often as needed.  Contact Lenses:  Switch to glasses for a short time. This will help stop damage to your eye.  Clean your contacts before wearing them again.  Throw away used contacts if they are meant to be thrown away.  Contagiousness:  Pinkeye is contagious. It is very easy to spread to other people. You can spread it by shaking hands.  Call Back If  Pus increases in amount  Pus in corner of eye lasts over 3 days  Eyelid becomes red or swollen  You become worse.  Prescription Option for Antibiotic Eyedrops  Per Protocol -  [place]:   Prescription: Polytrim (polymyxin-trimethoprim) eyedrops, 5 ml bottle ($12)  Dosing: 1 to 2 drops four times daily for 5 to 7 days. Note: drop covers the adult eye.

## 2013-10-02 ENCOUNTER — Telehealth: Payer: Self-pay | Admitting: Family Medicine

## 2013-10-02 NOTE — Telephone Encounter (Signed)
Patient Information:  Caller Name: Ahja  Phone: [phone]  Patient: Taylor Huerta, Lubinski  Gender: Female  DOB: 10-25-1975  Age: 38 Years  PCP: Carolann Littler (Family Practice)  Pregnant: No  Office Follow Up:  Does the office need to follow up with this patient?: No  Instructions For The Office: N/A  RN Note:  Standing orders for Polytrim eye drops -2 drops both eyee QID x5 days , NR Quantity sufficient.  Called to [employer] [phone]. Spoke with pharmacist Kit. Medication ready in 1 hour.  Care advice and call back parameters reviewed.  Symptoms  Reason For Call & Symptoms: Patient states she and her husband both awoke this morning with pink eye. She is requesting eye drops.  She states right eye, lash matting  yellow drainage, sclera pink and irritated. No injury, no FB.  Reviewed Health History In EMR: Yes  Reviewed Medications In EMR: Yes  Reviewed Allergies In EMR: Yes  Reviewed Surgeries / Procedures: Yes  Date of Onset of Symptoms: 10/02/2013  Treatments Tried: Left over eye drops that were old that she has at home Poly myxin B Sulfate and Trimethopime Opthalmic Solution Exp- 03/2014.  Treatments Tried Worked: No OB / GYN:  LMP: 09/20/2013  Guideline(s) Used:  Eye - Pus or Discharge  Disposition Per Guideline:   Home Care  Reason For Disposition Reached:   Eye with yellow/green discharge or eyelashes stick together, and PCP standing order to call in antibiotic eye drops  Advice Given:  Reassurance:  A small amount of pus or mucus in the inner corner of the eye is often due to a cold or virus. Sometimes it's just a reaction to an irritant in the eye.  You can get pink eye from someone who has had it recently.  Pink eye will not harm your eyesight.  Eyelid Cleansing:   Gently wash eyelids and lashes with warm water and wet cotton balls (or cotton gauze). Remove all the dried and liquid pus.  Do this as often as needed.  Contact Lenses:  Switch  to glasses for a short time. This will help stop damage to your eye.  Clean your contacts before wearing them again.  Throw away used contacts if they are meant to be thrown away.  Contagiousness:  Pinkeye is contagious. It is very easy to spread to other people. You can spread it by shaking hands.  Try not to touch your eyes. Wash your hands often. Do not share towels. Try not to touch your eyes. Wash your hands frequently. Do not share towels.  You may return to work or school. Avoid physical contact (e.g., shaking hands) until the symptoms have resolved.  Prescription Option for Antibiotic Eyedrops  Per Protocol - [place]:   Prescription: Polytrim (polymyxin-trimethoprim) eyedrops, 5 ml bottle ($12)  Dosing: 1 to 2 drops four times daily for 5 to 7 days. Note: drop covers the adult eye.  Contagiousness:  Pinkeye is contagious. It is very easy to spread to other people. You can spread it by shaking hands.  Try not to touch your eyes. Wash your hands often. Do not share towels. Try not to touch your eyes. Wash your hands frequently. Do not share towels.  You may return to work or school. Avoid physical contact (e.g., shaking hands) until the symptoms have resolved.  Patient Will Follow Care Advice:  YES

## 2013-12-17 ENCOUNTER — Telehealth: Payer: Self-pay | Admitting: Family Medicine

## 2013-12-17 NOTE — Telephone Encounter (Signed)
Patient Information:  Caller Name: Lanora Manislizabeth  Phone: [phone]  Patient: Barnett AbuHam, Yena B  Gender: Female  DOB: 03/09/1976  Age: 38 Years  PCP: Evelena PeatBurchette, [facility](Family Practice)  Pregnant: No  Office Follow Up:  Does the office need to follow up with this patient?: No  Instructions For The Office: N/A  RN Note:  Home care advice given.  Prefers to schedule for 12/18/13 vs today.  Symptoms  Reason For Call & Symptoms: Sharp pain in left ear, comes and goes, last just a couple of seconds, comes around about every 10 minutes today;  No redness or swelling to the outside of the ear;  No tenderness when touched;  Afebrile; No drainage.  She has had an issue with itching, and her dermatologist  has given her some Hydrocortisone creme to apply because the skin has been so dry;  Reviewed Health History In EMR: Yes  Reviewed Medications In EMR: Yes  Reviewed Allergies In EMR: Yes  Reviewed Surgeries / Procedures: Yes  Date of Onset of Symptoms: 12/16/2013  Treatments Tried: Heating pad  Treatments Tried Worked: No OB / GYN:  LMP: 12/16/2013  Guideline(s) Used:  Earache  Disposition Per Guideline:   See Today in Office  Reason For Disposition Reached:   All other earaches (Exceptions: earache lasting < 1 hour, and earache from air travel)  Advice Given:  Pain Medicines:  Ibuprofen (e.g., Motrin, Advil):  Take 400 mg (two 200 mg pills) by mouth every 6 hours.  Another choice is to take 600 mg (three 200 mg pills) by mouth every 8 hours.  The most you should take each day is 1,200 mg (six 200 mg pills), unless your doctor has told you to take more.  Apply Cold to the Area for Pain:  Apply a cold pack or a cold wet washcloth to the outer ear for 20 minutes to reduce pain while the pain medicine takes effect (Note: Some individuals prefer local heat instead of cold for 20 minutes).  Call Back If  Earache last more than 1 hour  High fever, severe headache, or stiff neck occurs  You become worse.  Patient Refused Recommendation:  Patient Refused Appt, Patient Requests Appt At Later Date  Wants appt. for 12/18/13

## 2013-12-18 ENCOUNTER — Encounter: Payer: Self-pay | Admitting: Family Medicine

## 2013-12-18 ENCOUNTER — Ambulatory Visit (INDEPENDENT_AMBULATORY_CARE_PROVIDER_SITE_OTHER): Payer: Managed Care, Other (non HMO) | Admitting: Family Medicine

## 2013-12-18 VITALS — BP 110/70 | HR 55 | Temp 98.0°F | Wt 115.0 lb

## 2013-12-18 DIAGNOSIS — R519 Headache, unspecified: Secondary | ICD-10-CM

## 2013-12-18 DIAGNOSIS — R51 Headache: Secondary | ICD-10-CM

## 2013-12-18 NOTE — Progress Notes (Signed)
Pre visit review using our clinic review tool, if applicable. No additional management support is needed unless otherwise documented below in the visit note. 

## 2013-12-19 NOTE — Progress Notes (Signed)
   Subjective:    Patient ID: Taylor Huerta, female    DOB: 01/04/1976, 38 y.o.   MRN: [id number]  Otalgia  Pertinent negatives include no ear discharge, headaches, hearing loss, rash or sore throat.   Left facial pain.  Onset 2 days ago.  Pain intermittent.  Quality is sharp and "stabbing".   Severe when occurs. Location is anterior to left ear.  No facial weakness.  No TMJ pain.  No rash.  Left eye waters occasionally.  No hx of similar pain. No alleviating or aggravating factors.  Denies fever, chills, headache, visual changes, or any facial swelling.  Past Medical History  Diagnosis Date  . Complication of anesthesia     severe itching  . Abnormal Pap smear   . 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 Cesarean section    . Compartment release l leg    . Leep    . Cesarean section  06/01/2011    Procedure: CESAREAN SECTION;  Surgeon: Meriel Picaichard M Holland;  Location: [facility];  Service: Gynecology;  Laterality: N/A;    reports that she has never smoked. She has never used smokeless tobacco. She reports that she drinks alcohol. She reports that she does not use illicit drugs. family history includes Alcohol abuse in her paternal grandmother; Diabetes in her paternal grandfather; Parkinsonism in her father. There is no history of Anesthesia problems. No Known Allergies    Review of Systems  Constitutional: Negative for fever and chills.  HENT: Positive for ear pain. Negative for ear discharge, hearing loss and sore throat.   Skin: Negative for rash.  Neurological: Negative for dizziness, weakness and headaches.  Hematological: Negative for adenopathy.       Objective:   Physical Exam  Constitutional: She is oriented to person, place, and time. She appears well-developed and well-nourished.  HENT:  Right Ear: External ear normal.  Left Ear: External ear normal.  Mouth/Throat: Oropharynx is clear and moist.  NO TMJ  tenderness.  No facial edema.  Neck: Neck supple. No thyromegaly present.  Cardiovascular: Normal rate.   Pulmonary/Chest: Effort normal and breath sounds normal. No respiratory distress. She has no wheezes. She has no rales.  Lymphadenopathy:    She has no cervical adenopathy.  Neurological: She is alert and oriented to person, place, and time. No cranial nerve deficit.  Skin: No rash noted.          Assessment & Plan:  Transient left facial pain-?neuralgia.  No evidence for TMJ.   No classic triggers for trigeminal neuralgia. Doubt atypical migraine.  Symptoms are very intermittent.  Just started 2 days ago.  Observe for now.  Consider trial of Tegretol or gabapentin if symptoms persist.  Consider neurology referral if symptoms persist.

## 2014-05-01 ENCOUNTER — Other Ambulatory Visit: Payer: Self-pay | Admitting: Obstetrics and Gynecology

## 2014-05-02 LAB — CYTOLOGY - PAP

## 2014-05-05 ENCOUNTER — Encounter: Payer: Self-pay | Admitting: Family Medicine

## 2014-08-13 ENCOUNTER — Ambulatory Visit: Payer: Managed Care, Other (non HMO) | Admitting: Sports Medicine

## 2014-08-19 ENCOUNTER — Ambulatory Visit: Payer: Managed Care, Other (non HMO) | Admitting: Sports Medicine

## 2014-08-26 ENCOUNTER — Encounter: Payer: Self-pay | Admitting: Sports Medicine

## 2014-08-26 ENCOUNTER — Ambulatory Visit (INDEPENDENT_AMBULATORY_CARE_PROVIDER_SITE_OTHER): Payer: Managed Care, Other (non HMO) | Admitting: Sports Medicine

## 2014-08-26 VITALS — BP 116/78 | HR 55 | Ht 62.0 in | Wt 115.0 lb

## 2014-08-26 DIAGNOSIS — M79673 Pain in unspecified foot: Secondary | ICD-10-CM

## 2014-08-26 DIAGNOSIS — M25571 Pain in right ankle and joints of right foot: Secondary | ICD-10-CM

## 2014-08-26 NOTE — Progress Notes (Signed)
   Subjective:    Patient ID: Taylor Huerta, female    DOB: 07/03/1976, 39 y.o.   MRN: [id number]  HPI Ms. Alfonse RasHam is a 39 year old female who presents for left ankle and right hamstring pain at the referral of Dr. Madelon Lipsaffrey. Today, she says that her hamstring and ankle are not bothering her at all. She says that she has not been running over the past 3 weeks, which has relieved her symptoms. She previously was running about 20 miles per week. She was previously diagnosed with right posterior tibialis tendinitis. She would like to return to running, but is unsure if her symptoms will return. She occasionally notices burning between her first and second toes bilaterally that is brought on with running. She denies any fevers, chills, bruising, or swelling. She denies any history of acute injury.  Past medical history, social history, medications, and allergies were reviewed and are up to date in the chart.  Review of Systems 7 point review of systems was performed and was otherwise negative unless noted in the history of present illness.     Objective:   Physical Exam BP 116/78 mmHg  Pulse 55  Ht 5\' 2"  (1.575 m)  Wt 115 lb (52.164 kg)  BMI 21.03 kg/m2 GEN: The patient is well-developed well-nourished female and in no acute distress.  She is awake alert and oriented x3. SKIN: warm and well-perfused, no rash  EXTR: No lower extremity edema or calf tenderness Neuro: Strength 5/5 globally. Sensation intact throughout. No focal deficits. Vasc: +2 bilateral distal pulses. No edema.  MSK: Structural exam reveals no leg length discrepancy. ASIS equal. She has a slightly flattened medial longitudinal arch on the left compared to the right. She is first ray dominant and has slight first ray bossing on the left more significantly than the right. Gait analysis reveals that she has additional motion at the left ankle with hyperpronation. She has fairly good running form. Full range of motion of the bilateral hips  without pain. Full range of motion of the bilateral knees without pain. Good hamstring strength bilaterally. Negative H test.    Assessment & Plan:  Please see problem based assessment and plan in the problem list. CC: Dr Madelon Lipsaffrey.

## 2014-08-26 NOTE — Assessment & Plan Note (Signed)
Left ankle hyperpronation. First ray dominant. -Fitted and applied green sport insoles with bilateral scaphoid pads and first ray posts. -Gait improved upon placing insoles. -Patient will trial over next 4 weeks -Plan follow-up in 4 weeks for re-evaluation and consideration of crafting custom orthotics if desires long-term insoles.

## 2014-09-23 ENCOUNTER — Encounter: Payer: Managed Care, Other (non HMO) | Admitting: Sports Medicine

## 2014-10-20 ENCOUNTER — Other Ambulatory Visit: Payer: Self-pay | Admitting: Obstetrics and Gynecology

## 2014-10-21 LAB — CYTOLOGY - PAP
